# Patient Record
Sex: Male | Born: 1965 | Race: Black or African American | Hispanic: No | Marital: Married | Smoking: Never smoker
Health system: Southern US, Community
[De-identification: ages and names within clinical notes are randomized; demographics above are authoritative.]

## PROBLEM LIST (undated history)

## (undated) DIAGNOSIS — E785 Hyperlipidemia, unspecified: Secondary | ICD-10-CM

## (undated) DIAGNOSIS — N200 Calculus of kidney: Secondary | ICD-10-CM

## (undated) DIAGNOSIS — I10 Essential (primary) hypertension: Secondary | ICD-10-CM

---

## 1997-07-01 HISTORY — PX: LITHOTRIPSY: SUR834

## 1999-02-06 ENCOUNTER — Encounter: Payer: Self-pay | Admitting: Emergency Medicine

## 1999-02-06 ENCOUNTER — Emergency Department (HOSPITAL_COMMUNITY): Admission: EM | Admit: 1999-02-06 | Discharge: 1999-02-06 | Payer: Self-pay | Admitting: Emergency Medicine

## 2001-10-13 ENCOUNTER — Encounter: Payer: Self-pay | Admitting: Emergency Medicine

## 2001-10-13 ENCOUNTER — Emergency Department (HOSPITAL_COMMUNITY): Admission: EM | Admit: 2001-10-13 | Discharge: 2001-10-13 | Payer: Self-pay | Admitting: Emergency Medicine

## 2005-11-01 ENCOUNTER — Encounter: Admission: RE | Admit: 2005-11-01 | Discharge: 2005-11-01 | Payer: Self-pay | Admitting: Family Medicine

## 2007-12-25 ENCOUNTER — Emergency Department (HOSPITAL_COMMUNITY): Admission: EM | Admit: 2007-12-25 | Discharge: 2007-12-26 | Payer: Self-pay | Admitting: Emergency Medicine

## 2010-12-12 ENCOUNTER — Emergency Department (INDEPENDENT_AMBULATORY_CARE_PROVIDER_SITE_OTHER): Payer: Managed Care, Other (non HMO)

## 2010-12-12 ENCOUNTER — Emergency Department (HOSPITAL_BASED_OUTPATIENT_CLINIC_OR_DEPARTMENT_OTHER)
Admission: EM | Admit: 2010-12-12 | Discharge: 2010-12-12 | Disposition: A | Discharge status: Home / Self Care | Payer: Managed Care, Other (non HMO) | Attending: Emergency Medicine | Admitting: Emergency Medicine

## 2010-12-12 DIAGNOSIS — M25569 Pain in unspecified knee: Secondary | ICD-10-CM | POA: Insufficient documentation

## 2010-12-12 DIAGNOSIS — M898X9 Other specified disorders of bone, unspecified site: Secondary | ICD-10-CM | POA: Insufficient documentation

## 2011-03-28 LAB — URINALYSIS, ROUTINE W REFLEX MICROSCOPIC
Bilirubin Urine: NEGATIVE
Specific Gravity, Urine: 1.022
pH: 5.5

## 2011-03-28 LAB — URINE MICROSCOPIC-ADD ON

## 2012-02-26 IMAGING — CR DG KNEE COMPLETE 4+V*L*
4 series · 4 of 4 positions shown · non-contrast
Comparison: None.

CLINICAL DATA: Suprapatellar knee pain.  No known injury.

LEFT KNEE - COMPLETE 4+ VIEW

[t knee ap left]
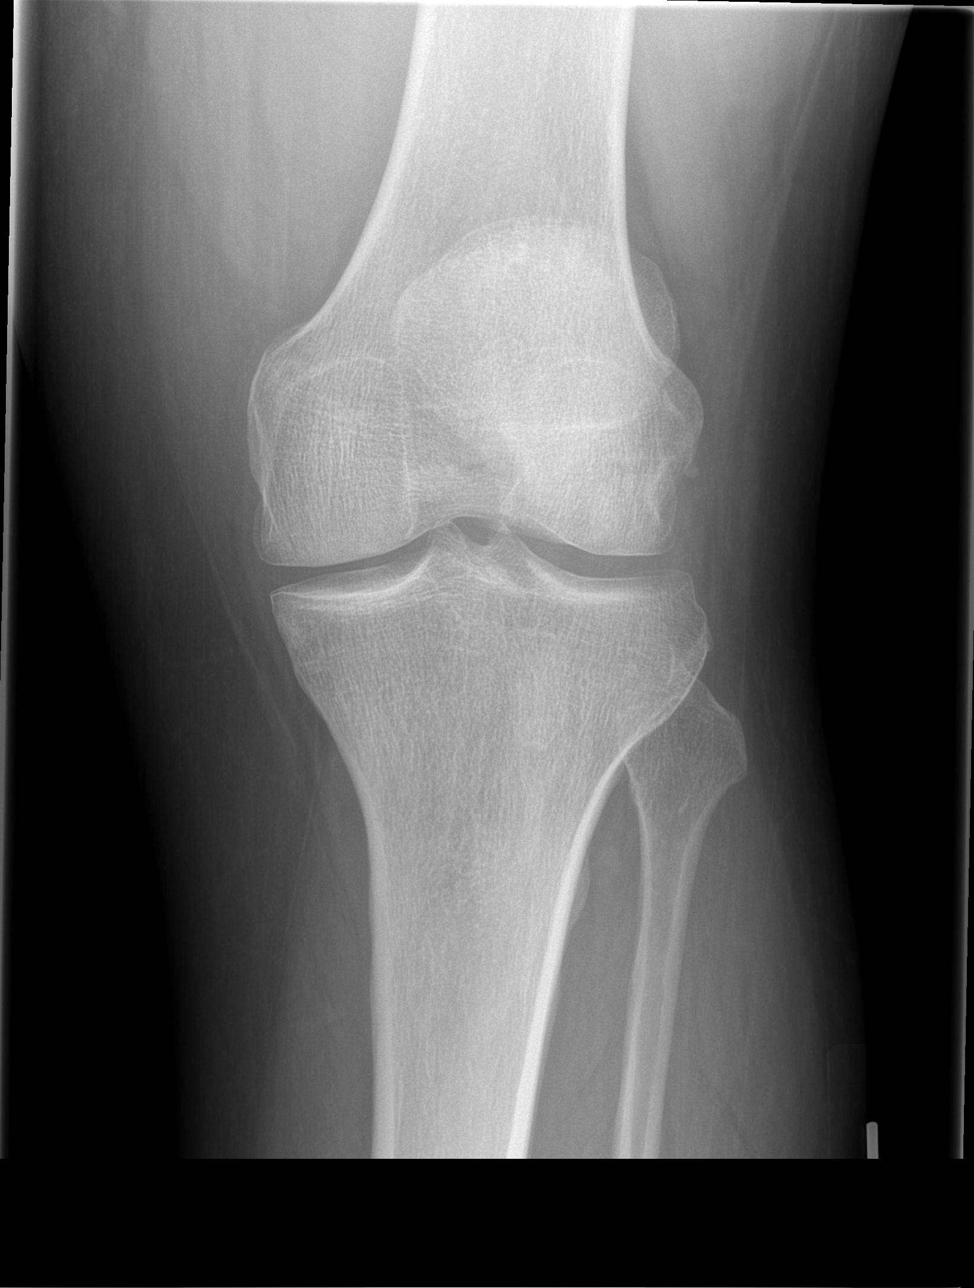

[t knee oblique left (1 of 2)]
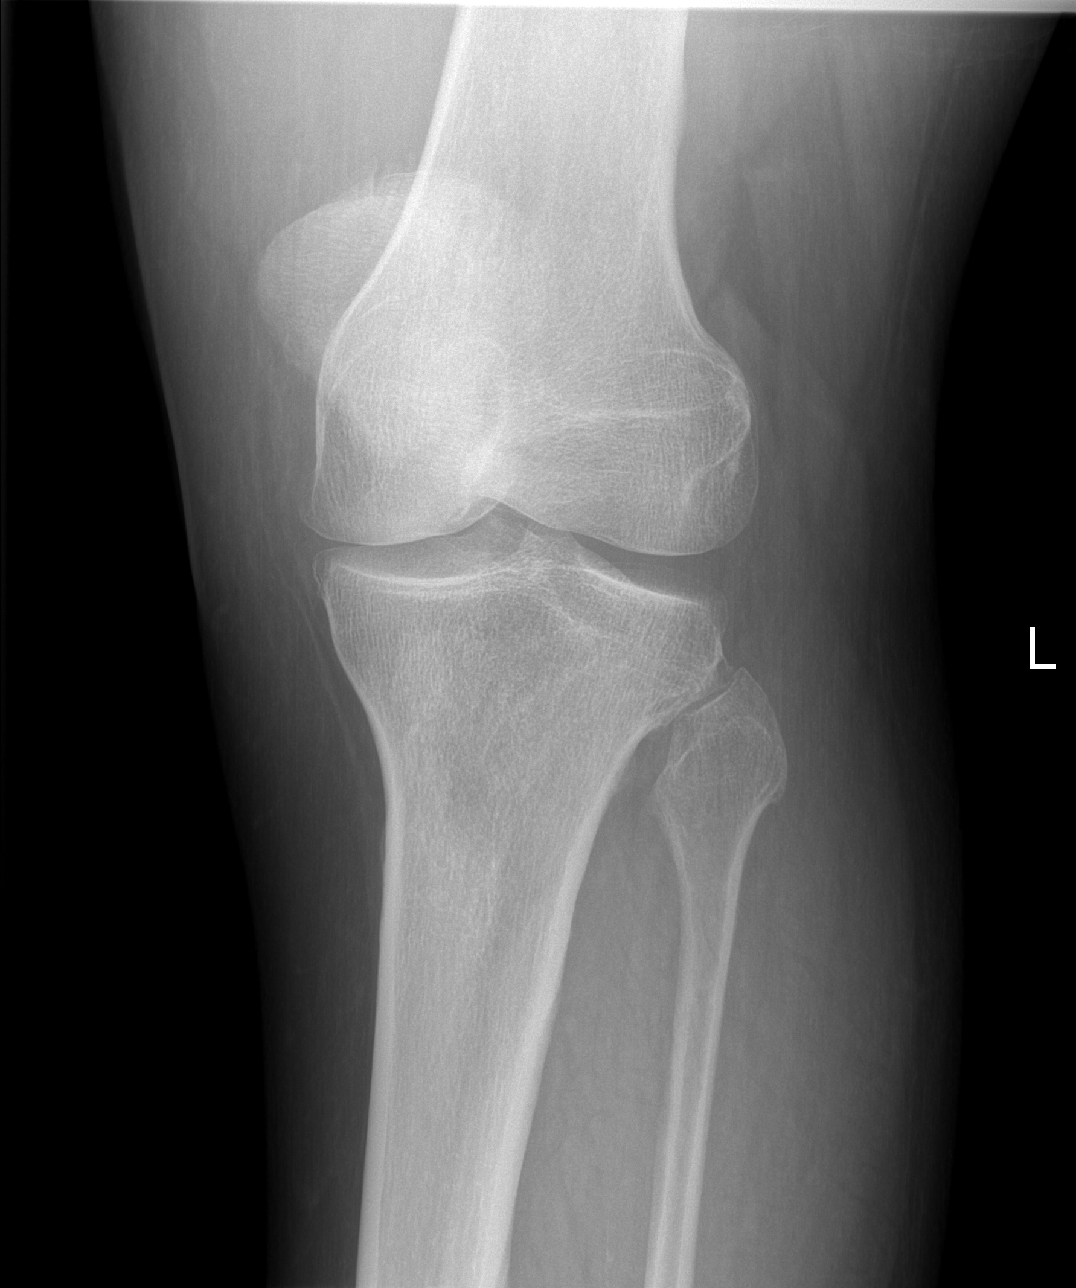

[t knee oblique left (2 of 2)]
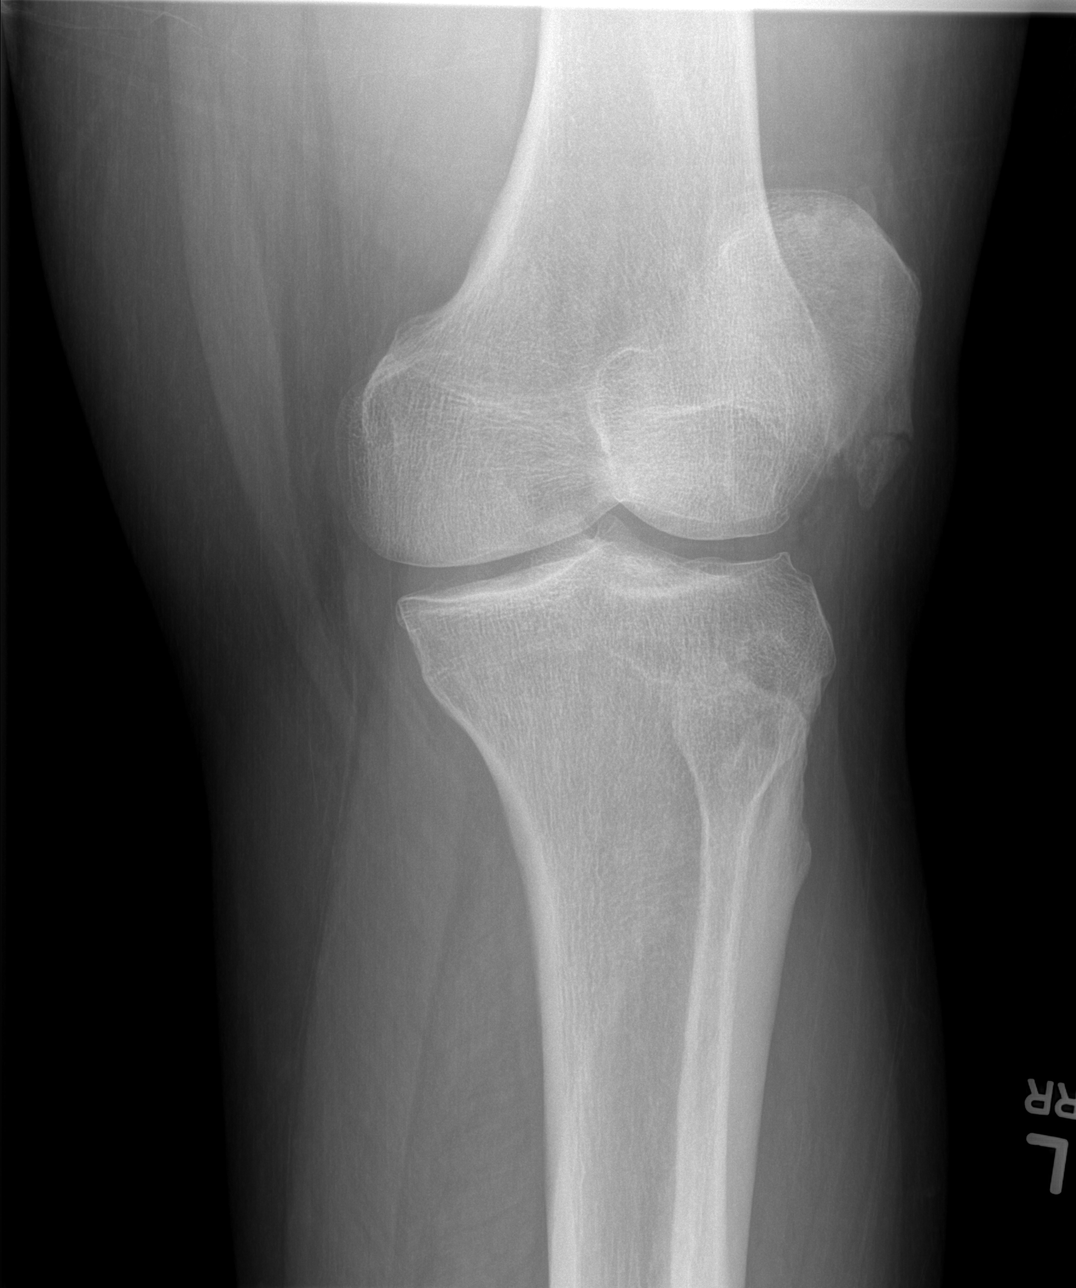

[t knee lat left]
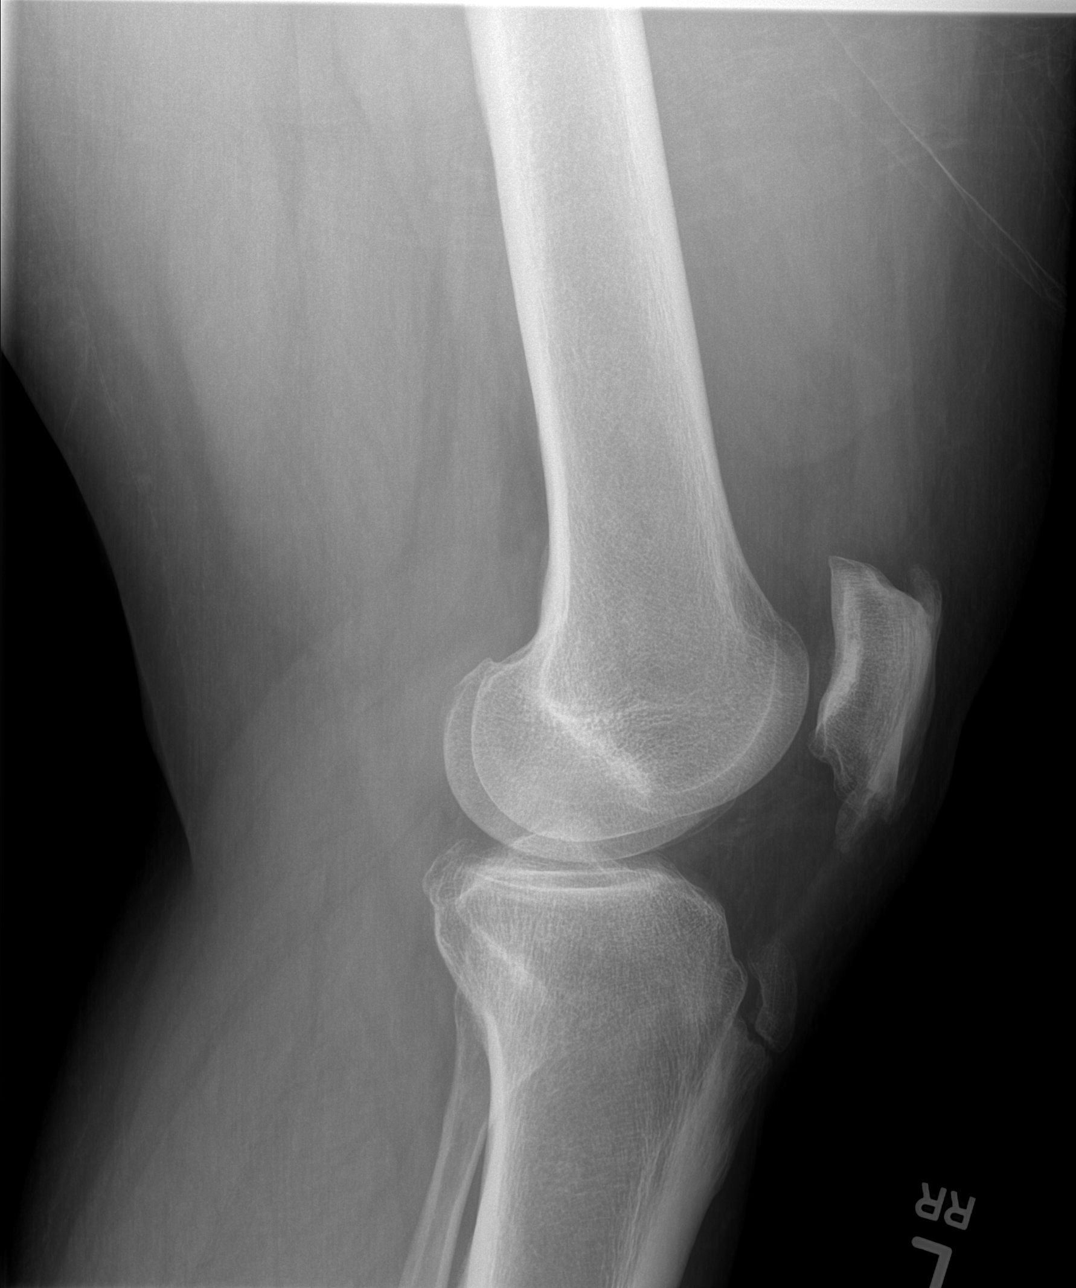

[4 of 4 positions shown; findings below may reference images not displayed]

FINDINGS: The mineralization and alignment are normal.  There is no
evidence of acute fracture or dislocation.  There is spurring at
the quadriceps and patellar insertions on the patella.  There is
developmental fragmentation of the tibial tubercle.  Mild
tricompartmental degenerative changes are present.  There is no
significant knee joint effusion.
IMPRESSION: Patellar spurring and mild tricompartmental degenerative changes.
No acute osseous findings.

## 2016-04-09 ENCOUNTER — Emergency Department (HOSPITAL_BASED_OUTPATIENT_CLINIC_OR_DEPARTMENT_OTHER)
Admission: EM | Admit: 2016-04-09 | Discharge: 2016-04-09 | Disposition: A | Discharge status: Home / Self Care | Payer: Managed Care, Other (non HMO) | Attending: Emergency Medicine | Admitting: Emergency Medicine

## 2016-04-09 ENCOUNTER — Encounter (HOSPITAL_BASED_OUTPATIENT_CLINIC_OR_DEPARTMENT_OTHER): Payer: Self-pay

## 2016-04-09 DIAGNOSIS — L02212 Cutaneous abscess of back [any part, except buttock]: Secondary | ICD-10-CM

## 2016-04-09 DIAGNOSIS — I10 Essential (primary) hypertension: Secondary | ICD-10-CM | POA: Diagnosis not present

## 2016-04-09 HISTORY — DX: Hyperlipidemia, unspecified: E78.5

## 2016-04-09 HISTORY — DX: Essential (primary) hypertension: I10

## 2016-04-09 HISTORY — DX: Calculus of kidney: N20.0

## 2016-04-09 MED ORDER — LIDOCAINE HCL 1 % IJ SOLN
INTRAMUSCULAR | Status: AC
  Administered 2016-04-09: 20 mL
  Filled 2016-04-09: qty 20

## 2016-04-09 MED ORDER — CEPHALEXIN 500 MG PO CAPS: 500.0000 mg | ORAL_CAPSULE | Freq: Four times a day (QID) | ORAL | 0 refills | Status: DC

## 2016-04-09 MED ORDER — ONDANSETRON 8 MG PO TBDP
8.0000 mg | ORAL_TABLET | Freq: Once | ORAL | Status: AC
  Administered 2016-04-09: 8 mg via ORAL
  Filled 2016-04-09: qty 1

## 2016-04-09 MED ORDER — LIDOCAINE HCL (PF) 1 % IJ SOLN
INTRAMUSCULAR | Status: AC
  Administered 2016-04-09: 5 mL
  Filled 2016-04-09: qty 5

## 2016-04-09 MED ORDER — HYDROCODONE-ACETAMINOPHEN 5-325 MG PO TABS
2.0000 | ORAL_TABLET | Freq: Once | ORAL | Status: AC
  Administered 2016-04-09: 2 via ORAL
  Filled 2016-04-09: qty 2

## 2016-04-09 MED ORDER — HYDROCODONE-ACETAMINOPHEN 5-325 MG PO TABS: 1.0000 | ORAL_TABLET | Freq: Four times a day (QID) | ORAL | 0 refills | Status: DC | PRN

## 2016-04-09 MED ORDER — CEFTRIAXONE SODIUM 1 G IJ SOLR
1.0000 g | Freq: Once | INTRAMUSCULAR | Status: AC
  Administered 2016-04-09: 1 g via INTRAMUSCULAR
  Filled 2016-04-09: qty 10

## 2016-04-09 NOTE — ED Provider Notes (Signed)
[facility] Note   CSN: [id number] Arrival date & time: 04/09/16  1916  By signing my name below, I, Soijett Blue, attest that this documentation has been prepared under the direction and in the presence of Geoffery Lyonsouglas Jaycub Noorani, MD. Electronically Signed: Soijett Blue, ED Scribe. 04/09/16. 7:54 PM.   History   Chief Complaint Chief Complaint  Patient presents with  . Abscess    HPI Omar Wheeler is a 50 y.o. male with a PMHx of HTN, who presents to the Emergency Department complaining of abscess to left middle back onset 5 days. Pt states that he went to the doctor 2 days ago and was Rx bactrim without the area being drained. Pt notes that he has had abscesses in the past, but never to this extent. He states that he is having associated symptoms of drainage, fever, and redness to the affected area. He states that he has tried warm compresses/soaks and Rx bactrim for the relief of his symptoms. He denies chills and any other symptoms.    The history is provided by the patient. No language interpreter was used.    Past Medical History:  Diagnosis Date  . Hyperlipidemia   . Hypertension   . Kidney stone     There are no active problems to display for this patient.   Past Surgical History:  Procedure Laterality Date  . LITHOTRIPSY Left 1999       Home Medications    Prior to Admission medications   Not on File    Family History No family history on file.  Social History Social History  Substance Use Topics  . Smoking status: Not on file  . Smokeless tobacco: Not on file  . Alcohol use Not on file     Allergies   Review of patient's allergies indicates no known allergies.   Review of Systems Review of Systems  All other systems reviewed and are negative.    Physical Exam Updated Vital Signs BP 137/91 (BP Location: Left Arm)   Pulse 117   Temp 100 F (37.8 C) (Oral)   Resp 20   Ht 5\' 10"  (1.778 m)   Wt (!) 303 lb (137.4 kg)    SpO2 100%   BMI 43.48 kg/m   Physical Exam  Constitutional: He is oriented to person, place, and time. He appears well-developed and well-nourished. No distress.  HENT:  Head: Normocephalic and atraumatic.  Eyes: EOM are normal.  Neck: Neck supple.  Cardiovascular: Normal rate.   Pulmonary/Chest: Effort normal. No respiratory distress.  Abdominal: He exhibits no distension.  Musculoskeletal: Normal range of motion.  Neurological: He is alert and oriented to person, place, and time.  Skin: Skin is warm and dry. There is erythema.  Left mid back is noted to have a draining lesion present, measuring 5 x 10 cm. There is fluctuance and induration of the overlying skin. Overlying erythema without red streaking.   Psychiatric: He has a normal mood and affect. His behavior is normal.  Nursing note and vitals reviewed.    ED Treatments / Results  DIAGNOSTIC STUDIES: Oxygen Saturation is 100% on RA, nl by my interpretation.    COORDINATION OF CARE: 7:49 PM Discussed treatment plan with pt at bedside which includes I&D, continue bactrim Rx, and pt agreed to plan.   Procedures .Marland Kitchen.Incision and Drainage Date/Time: 04/09/2016 7:53 PM Performed by: Geoffery LyonsELO, Yasmine Kilbourne Authorized by: Geoffery LyonsELO, Jourden Gilson   Consent:    Consent obtained:  Verbal   Consent given by:  Patient   Risks discussed:  Incomplete drainage, pain and infection   Alternatives discussed:  Alternative treatment Location:    Type:  Abscess   Size:  5 x 10 cm   Location:  Trunk   Trunk location:  Back Pre-procedure details:    Skin preparation:  Betadine Anesthesia (see MAR for exact dosages):    Anesthesia method:  Local infiltration   Local anesthetic:  Lidocaine 1% w/o epi (5 cc used) Procedure type:    Complexity:  Simple Procedure details:    Needle aspiration: no     Incision types:  Stab incision   Incision depth:  Dermal   Scalpel blade:  11   Wound management:  Probed and deloculated and irrigated with saline    Drainage:  Purulent   Drainage amount:  Copious   Wound treatment:  Wound left open   Packing materials:  None Post-procedure details:    Patient tolerance of procedure:  Tolerated well, no immediate complications    (including critical care time)  Medications Ordered in ED Medications  lidocaine (XYLOCAINE) 1 % (with pres) injection (not administered)     Initial Impression / Assessment and Plan / ED Course  I have reviewed the triage vital signs and the nursing notes.  Clinical Course    Back abscess I&Ded with copious pus expressed. I will add Keflex to the Bactrim he is are taking. He is also to perform warm soaks. He will also be given pain medication. He understands to return if his symptoms worsen or change.  Final Clinical Impressions(s) / ED Diagnoses   Final diagnoses:  None    New Prescriptions New Prescriptions   No medications on file    I personally performed the services described in this documentation, which was scribed in my presence. The recorded information has been reviewed and is accurate.        Geoffery Lyons, MD 04/09/16 2135

## 2016-04-09 NOTE — ED Notes (Signed)
Pt c/o nausea to registration on way out. Verbal order for ODT Zofran received from Dr. Judd Lienelo and given.

## 2016-04-09 NOTE — Discharge Instructions (Signed)
Continue Bactrim as prescribed. Begin taking Keflex as prescribed today.  Hydrocodone as prescribed as needed for pain.  Continue to apply warm soaks as frequently as possible for the next several days.  Return to the emergency department if your symptoms significantly worsen or change.

## 2016-04-09 NOTE — ED Triage Notes (Signed)
Pt has an abscess that is draining on his middle left back for the last 5 days.  He started on Bactrim Sunday and the pain, redness and swelling has gotten worse and pt is running a fever.

## 2019-02-02 ENCOUNTER — Other Ambulatory Visit: Payer: Self-pay

## 2019-02-02 ENCOUNTER — Ambulatory Visit (INDEPENDENT_AMBULATORY_CARE_PROVIDER_SITE_OTHER): Payer: Managed Care, Other (non HMO) | Admitting: Emergency Medicine

## 2019-02-02 ENCOUNTER — Encounter: Payer: Self-pay | Admitting: Emergency Medicine

## 2019-02-02 VITALS — BP 112/77 | HR 88 | Temp 97.9°F | Ht 70.0 in | Wt 296.2 lb

## 2019-02-02 DIAGNOSIS — Z8639 Personal history of other endocrine, nutritional and metabolic disease: Secondary | ICD-10-CM | POA: Insufficient documentation

## 2019-02-02 DIAGNOSIS — Z6841 Body Mass Index (BMI) 40.0 and over, adult: Secondary | ICD-10-CM

## 2019-02-02 DIAGNOSIS — Z7689 Persons encountering health services in other specified circumstances: Secondary | ICD-10-CM | POA: Diagnosis not present

## 2019-02-02 DIAGNOSIS — E1165 Type 2 diabetes mellitus with hyperglycemia: Secondary | ICD-10-CM | POA: Diagnosis not present

## 2019-02-02 NOTE — Patient Instructions (Addendum)
   If you have lab work done today you will be contacted with your lab results within the next 2 weeks.  If you have not heard from us then please contact us. The fastest way to get your results is to register for My Chart.   IF you received an x-ray today, you will receive an invoice from [facility]. Please contact [facility] at [phone] with questions or concerns regarding your invoice.   IF you received labwork today, you will receive an invoice from [facility]. Please contact [facility] at [phone] with questions or concerns regarding your invoice.   Our billing staff will not be able to assist you with questions regarding bills from these companies.  You will be contacted with the lab results as soon as they are available. The fastest way to get your results is to activate your My Chart account. Instructions are located on the last page of this paperwork. If you have not heard from us regarding the results in 2 weeks, please contact this office.     Diabetes Mellitus and Nutrition, Adult When you have diabetes (diabetes mellitus), it is very important to have healthy eating habits because your blood sugar (glucose) levels are greatly affected by what you eat and drink. Eating healthy foods in the appropriate amounts, at about the same times every day, can help you:  Control your blood glucose.  Lower your risk of heart disease.  Improve your blood pressure.  Reach or maintain a healthy weight. Every person with diabetes is different, and each person has different needs for a meal plan. Your health care provider may recommend that you work with a diet and nutrition specialist (dietitian) to make a meal plan that is best for you. Your meal plan may vary depending on factors such as:  The calories you need.  The medicines you take.  Your weight.  Your blood glucose, blood pressure, and cholesterol levels.  Your activity level.  Other health conditions  you have, such as heart or kidney disease. How do carbohydrates affect me? Carbohydrates, also called carbs, affect your blood glucose level more than any other type of food. Eating carbs naturally raises the amount of glucose in your blood. Carb counting is a method for keeping track of how many carbs you eat. Counting carbs is important to keep your blood glucose at a healthy level, especially if you use insulin or take certain oral diabetes medicines. It is important to know how many carbs you can safely have in each meal. This is different for every person. Your dietitian can help you calculate how many carbs you should have at each meal and for each snack. Foods that contain carbs include:  Bread, cereal, rice, pasta, and crackers.  Potatoes and corn.  Peas, beans, and lentils.  Milk and yogurt.  Fruit and juice.  Desserts, such as cakes, cookies, ice cream, and candy. How does alcohol affect me? Alcohol can cause a sudden decrease in blood glucose (hypoglycemia), especially if you use insulin or take certain oral diabetes medicines. Hypoglycemia can be a life-threatening condition. Symptoms of hypoglycemia (sleepiness, dizziness, and confusion) are similar to symptoms of having too much alcohol. If your health care provider says that alcohol is safe for you, follow these guidelines:  Limit alcohol intake to no more than 1 drink per day for nonpregnant women and 2 drinks per day for men. One drink equals 12 oz of beer, 5 oz of wine, or 1 oz of hard liquor.    Do not drink on an empty stomach.  Keep yourself hydrated with water, diet soda, or unsweetened iced tea.  Keep in mind that regular soda, juice, and other mixers may contain a lot of sugar and must be counted as carbs. What are tips for following this plan?  Reading food labels  Start by checking the serving size on the "Nutrition Facts" label of packaged foods and drinks. The amount of calories, carbs, fats, and other  nutrients listed on the label is based on one serving of the item. Many items contain more than one serving per package.  Check the total grams (g) of carbs in one serving. You can calculate the number of servings of carbs in one serving by dividing the total carbs by 15. For example, if a food has 30 g of total carbs, it would be equal to 2 servings of carbs.  Check the number of grams (g) of saturated and trans fats in one serving. Choose foods that have low or no amount of these fats.  Check the number of milligrams (mg) of salt (sodium) in one serving. Most people should limit total sodium intake to less than 2,300 mg per day.  Always check the nutrition information of foods labeled as "low-fat" or "nonfat". These foods may be higher in added sugar or refined carbs and should be avoided.  Talk to your dietitian to identify your daily goals for nutrients listed on the label. Shopping  Avoid buying canned, premade, or processed foods. These foods tend to be high in fat, sodium, and added sugar.  Shop around the outside edge of the grocery store. This includes fresh fruits and vegetables, bulk grains, fresh meats, and fresh dairy. Cooking  Use low-heat cooking methods, such as baking, instead of high-heat cooking methods like deep frying.  Cook using healthy oils, such as olive, canola, or sunflower oil.  Avoid cooking with butter, cream, or high-fat meats. Meal planning  Eat meals and snacks regularly, preferably at the same times every day. Avoid going long periods of time without eating.  Eat foods high in fiber, such as fresh fruits, vegetables, beans, and whole grains. Talk to your dietitian about how many servings of carbs you can eat at each meal.  Eat 4-6 ounces (oz) of lean protein each day, such as lean meat, chicken, fish, eggs, or tofu. One oz of lean protein is equal to: ? 1 oz of meat, chicken, or fish. ? 1 egg. ?  cup of tofu.  Eat some foods each day that contain  healthy fats, such as avocado, nuts, seeds, and fish. Lifestyle  Check your blood glucose regularly.  Exercise regularly as told by your health care provider. This may include: ? 150 minutes of moderate-intensity or vigorous-intensity exercise each week. This could be brisk walking, biking, or water aerobics. ? Stretching and doing strength exercises, such as yoga or weightlifting, at least 2 times a week.  Take medicines as told by your health care provider.  Do not use any products that contain nicotine or tobacco, such as cigarettes and e-cigarettes. If you need help quitting, ask your health care provider.  Work with a counselor or diabetes educator to identify strategies to manage stress and any emotional and social challenges. Questions to ask a health care provider  Do I need to meet with a diabetes educator?  Do I need to meet with a dietitian?  What number can I call if I have questions?  When are the best times to   check my blood glucose? Where to find more information:  American Diabetes Association: diabetes.org  Academy of Nutrition and Dietetics: www.eatright.org  National Institute of Diabetes and Digestive and Kidney Diseases (NIH): www.niddk.nih.gov Summary  A healthy meal plan will help you control your blood glucose and maintain a healthy lifestyle.  Working with a diet and nutrition specialist (dietitian) can help you make a meal plan that is best for you.  Keep in mind that carbohydrates (carbs) and alcohol have immediate effects on your blood glucose levels. It is important to count carbs and to use alcohol carefully. This information is not intended to replace advice given to you by your health care provider. Make sure you discuss any questions you have with your health care provider. Document Released: 03/14/2005 Document Revised: 05/30/2017 Document Reviewed: 07/22/2016 Elsevier Patient Education  2020 [employer]  

## 2019-02-02 NOTE — Assessment & Plan Note (Signed)
Patient has been noncompliant with metformin and his hemoglobin A1c last June was around 11.  Since he started taking metformin 1000 mg twice a day and states glucose at bedtime around 120.  In the mornings glucose around 140s.  Advised to continue taking metformin along with diet and exercise.  Blood work done today.  Recheck in 3 months.

## 2019-02-02 NOTE — Progress Notes (Signed)
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   Chief Complaint  Patient presents with   Establish Care   Diabetes    HISTORY OF PRESENT ILLNESS: This is a 53 y.o. male here to establish care with me. Has a history of diabetes, on metformin 1000 mg twice a day. Also states he has history of high cholesterol and taking a statin but does not recall the name. Non-smoker.  No EtOH user. No other significant medical history. Has no complaints or medical concerns today.  HPI   Prior to Admission medications   Medication Sig Start Date End Date Taking? Authorizing Provider  Accu-Chek FastClix Lancets MISC  12/22/18  Yes [provider]  Blood Glucose Monitoring Suppl (ACCU-CHEK GUIDE) w/Device KIT  12/22/18  Yes [provider]  Lancets 30G MISC Use to check BS 3 times a day/ DX: E11.9 04/10/16  Yes [provider]  losartan (COZAAR) 25 MG tablet  12/21/18  Yes [provider]  metFORMIN (GLUCOPHAGE) 1000 MG tablet  12/21/18  Yes [provider]    No Known Allergies  There are no active problems to display for this patient.   Past Medical History:  Diagnosis Date   Hyperlipidemia    Hypertension    Kidney stone     Past Surgical History:  Procedure Laterality Date   LITHOTRIPSY Left 1999    Social History   Socioeconomic History   Marital status: Married    Spouse name: Not on file   Number of children: Not on file   Years of education: Not on file   Highest education level: Not on file  Occupational History   Not on file  Social Needs   Financial resource strain: Not on file   Food insecurity    Worry: Not on file    Inability: Not on file   Transportation needs    Medical: Not on file    Non-medical: Not on file  Tobacco Use   Smoking status: Never Smoker   Smokeless tobacco: Never Used  Substance and Sexual Activity   Alcohol use: Never   Frequency: Never   Drug use: Never   Sexual activity: Yes  Lifestyle   Physical activity    Days per week: Not on file    Minutes per session: Not on file   Stress: Not on file  Relationships   Social connections    Talks on phone: Not on file    Gets together: Not on file    Attends religious service: Not on file    Active member of club or organization: Not on file    Attends meetings of clubs or organizations: Not on file    Relationship status: Not on file   Intimate partner violence    Fear of current or ex partner: Not on file    Emotionally abused: Not on file    Physically abused: Not on file    Forced sexual activity: Not on file  Other Topics Concern   Not on file  Social History Narrative   Not on file    History reviewed. No pertinent family history.   Review of Systems  Constitutional: Negative.  Negative for chills and fever.  HENT: Negative.  Negative for nosebleeds and sore throat.   Eyes: Negative.  Negative for blurred vision and double vision.  Respiratory: Negative.  Negative for cough and shortness of breath.   Cardiovascular: Negative.  Negative for chest pain and palpitations.  Gastrointestinal: Negative.  Negative for abdominal pain, blood in stool, diarrhea, melena, nausea and vomiting.  Genitourinary: Negative.  Negative for dysuria and hematuria.  Musculoskeletal: Negative.  Negative for myalgias.  Skin: Negative.   Neurological: Negative.  Negative for dizziness and headaches.  Endo/Heme/Allergies: Negative.   All other systems reviewed and are negative.  Vitals:   02/02/19 1353  BP: 112/77  Pulse: 88  Temp: 97.9 F (36.6 C)  SpO2: 97%     Physical Exam Vitals signs reviewed.  Constitutional:      Appearance: He is obese.  HENT:     Head: Normocephalic and atraumatic.  Eyes:     Extraocular Movements: Extraocular movements intact.     Conjunctiva/sclera: Conjunctivae normal.     Pupils: Pupils are equal, round, and  reactive to light.  Neck:     Musculoskeletal: Normal range of motion and neck supple.  Cardiovascular:     Rate and Rhythm: Normal rate.     Pulses: Normal pulses.     Heart sounds: Normal heart sounds.  Pulmonary:     Effort: Pulmonary effort is normal.     Breath sounds: Normal breath sounds.  Abdominal:     Palpations: Abdomen is soft.     Tenderness: There is no abdominal tenderness.  Musculoskeletal: Normal range of motion.  Skin:    General: Skin is warm and dry.     Capillary Refill: Capillary refill takes less than 2 seconds.  Neurological:     General: No focal deficit present.     Mental Status: He is alert and oriented to person, place, and time.  Psychiatric:        Mood and Affect: Mood normal.        Behavior: Behavior normal.      ASSESSMENT & PLAN: Slayden was seen today for establish care and diabetes.  Diagnoses and all orders for this visit:  Type 2 diabetes mellitus with hyperglycemia, without long-term current use of insulin (HCC) -     CBC with Differential/Platelet -     Comprehensive metabolic panel -     Hemoglobin A1c -     Lipid panel  History of high cholesterol  Encounter to establish care  Morbid obesity with BMI of 40.0-44.9, adult ([place])   Type 2 diabetes mellitus with hyperglycemia, without long-term current use of insulin ([place]) Patient has been noncompliant with metformin and his hemoglobin A1c last June was around 11.  Since he started taking metformin 1000 mg twice a day and states glucose at bedtime around 120.  In the mornings glucose around 140s.  Advised to continue taking metformin along with diet and exercise.  Blood work done today.  Recheck in 3 months.   Patient Instructions       If you have lab work done today you will be contacted with your lab results within the next 2 weeks.  If you have not heard from [place] then please contact us. The fastest way to get your results is to register for My Chart.   IF you received  an x-ray today, you will receive an invoice from [facility]. Please contact [facility] at [phone] with questions or concerns regarding your invoice.   IF you received labwork today, you will receive an invoice from [facility]. Please contact [facility] at [phone] with questions or concerns regarding your invoice.   Our billing staff will not be able to assist you with questions regarding bills from  these companies.  You will be contacted with the lab results as soon as they are available. The fastest way to get your results is to activate your My Chart account. Instructions are located on the last page of this paperwork. If you have not heard from [place] regarding the results in 2 weeks, please contact this office.      Diabetes Mellitus and Nutrition, Adult When you have diabetes (diabetes mellitus), it is very important to have healthy eating habits because your blood sugar (glucose) levels are greatly affected by what you eat and drink. Eating healthy foods in the appropriate amounts, at about the same times every day, can help you:  Control your blood glucose.  Lower your risk of heart disease.  Improve your blood pressure.  Reach or maintain a healthy weight. Every person with diabetes is different, and each person has different needs for a meal plan. Your health care provider may recommend that you work with a diet and nutrition specialist (dietitian) to make a meal plan that is best for you. Your meal plan may vary depending on factors such as:  The calories you need.  The medicines you take.  Your weight.  Your blood glucose, blood pressure, and cholesterol levels.  Your activity level.  Other health conditions you have, such as heart or kidney disease. How do carbohydrates affect me? Carbohydrates, also called carbs, affect your blood glucose level more than any other type of food. Eating carbs naturally raises the amount of glucose in your blood.  Carb counting is a method for keeping track of how many carbs you eat. Counting carbs is important to keep your blood glucose at a healthy level, especially if you use insulin or take certain oral diabetes medicines. It is important to know how many carbs you can safely have in each meal. This is different for every person. Your dietitian can help you calculate how many carbs you should have at each meal and for each snack. Foods that contain carbs include:  Bread, cereal, rice, pasta, and crackers.  Potatoes and corn.  Peas, beans, and lentils.  Milk and yogurt.  Fruit and juice.  Desserts, such as cakes, cookies, ice cream, and candy. How does alcohol affect me? Alcohol can cause a sudden decrease in blood glucose (hypoglycemia), especially if you use insulin or take certain oral diabetes medicines. Hypoglycemia can be a life-threatening condition. Symptoms of hypoglycemia (sleepiness, dizziness, and confusion) are similar to symptoms of having too much alcohol. If your health care provider says that alcohol is safe for you, follow these guidelines:  Limit alcohol intake to no more than 1 drink per day for nonpregnant women and 2 drinks per day for men. One drink equals 12 oz of beer, 5 oz of wine, or 1 oz of hard liquor.  Do not drink on an empty stomach.  Keep yourself hydrated with water, diet soda, or unsweetened iced tea.  Keep in mind that regular soda, juice, and other mixers may contain a lot of sugar and must be counted as carbs. What are tips for following this plan?  Reading food labels  Start by checking the serving size on the "Nutrition Facts" label of packaged foods and drinks. The amount of calories, carbs, fats, and other nutrients listed on the label is based on one serving of the item. Many items contain more than one serving per package.  Check the total grams (g) of carbs in one serving. You can calculate the number of servings  of carbs in one serving by  dividing the total carbs by 15. For example, if a food has 30 g of total carbs, it would be equal to 2 servings of carbs.  Check the number of grams (g) of saturated and trans fats in one serving. Choose foods that have low or no amount of these fats.  Check the number of milligrams (mg) of salt (sodium) in one serving. Most people should limit total sodium intake to less than 2,300 mg per day.  Always check the nutrition information of foods labeled as "low-fat" or "nonfat". These foods may be higher in added sugar or refined carbs and should be avoided.  Talk to your dietitian to identify your daily goals for nutrients listed on the label. Shopping  Avoid buying canned, premade, or processed foods. These foods tend to be high in fat, sodium, and added sugar.  Shop around the outside edge of the grocery store. This includes fresh fruits and vegetables, bulk grains, fresh meats, and fresh dairy. Cooking  Use low-heat cooking methods, such as baking, instead of high-heat cooking methods like deep frying.  Cook using healthy oils, such as olive, canola, or sunflower oil.  Avoid cooking with butter, cream, or high-fat meats. Meal planning  Eat meals and snacks regularly, preferably at the same times every day. Avoid going long periods of time without eating.  Eat foods high in fiber, such as fresh fruits, vegetables, beans, and whole grains. Talk to your dietitian about how many servings of carbs you can eat at each meal.  Eat 4-6 ounces (oz) of lean protein each day, such as lean meat, chicken, fish, eggs, or tofu. One oz of lean protein is equal to: ? 1 oz of meat, chicken, or fish. ? 1 egg. ?  cup of tofu.  Eat some foods each day that contain healthy fats, such as avocado, nuts, seeds, and fish. Lifestyle  Check your blood glucose regularly.  Exercise regularly as told by your health care provider. This may include: ? 150 minutes of moderate-intensity or vigorous-intensity  exercise each week. This could be brisk walking, biking, or water aerobics. ? Stretching and doing strength exercises, such as yoga or weightlifting, at least 2 times a week.  Take medicines as told by your health care provider.  Do not use any products that contain nicotine or tobacco, such as cigarettes and e-cigarettes. If you need help quitting, ask your health care provider.  Work with a Social worker or diabetes educator to identify strategies to manage stress and any emotional and social challenges. Questions to ask a health care provider  Do I need to meet with a diabetes educator?  Do I need to meet with a dietitian?  What number can I call if I have questions?  When are the best times to check my blood glucose? Where to find more information:  American Diabetes Association: diabetes.org  Academy of Nutrition and Dietetics: www.eatright.[employer] of Diabetes and Digestive and Kidney Diseases (NIH): [employer] Summary  A healthy meal plan will help you control your blood glucose and maintain a healthy lifestyle.  Working with a diet and nutrition specialist (dietitian) can help you make a meal plan that is best for you.  Keep in mind that carbohydrates (carbs) and alcohol have immediate effects on your blood glucose levels. It is important to count carbs and to use alcohol carefully. This information is not intended to replace advice given to you by your health care provider. Make sure  you discuss any questions you have with your health care provider. Document Released: 03/14/2005 Document Revised: 05/30/2017 Document Reviewed: 07/22/2016 Elsevier Patient Education  2020 Elsevier Inc.         Agustina Caroli, MD [facility]

## 2019-02-03 ENCOUNTER — Other Ambulatory Visit: Payer: Self-pay | Admitting: Emergency Medicine

## 2019-02-03 DIAGNOSIS — E1165 Type 2 diabetes mellitus with hyperglycemia: Secondary | ICD-10-CM

## 2019-02-03 LAB — COMPREHENSIVE METABOLIC PANEL
ALT: 16 IU/L (ref 0–44)
AST: 15 IU/L (ref 0–40)
Albumin/Globulin Ratio: 1.3 (ref 1.2–2.2)
Albumin: 4.2 g/dL (ref 3.8–4.9)
Alkaline Phosphatase: 62 IU/L (ref 39–117)
BUN/Creatinine Ratio: 25 — ABNORMAL HIGH (ref 9–20)
BUN: 27 mg/dL — ABNORMAL HIGH (ref 6–24)
Bilirubin Total: 0.2 mg/dL (ref 0.0–1.2)
CO2: 21 mmol/L (ref 20–29)
Calcium: 9.9 mg/dL (ref 8.7–10.2)
Chloride: 102 mmol/L (ref 96–106)
Creatinine, Ser: 1.08 mg/dL (ref 0.76–1.27)
GFR calc Af Amer: 91 mL/min/{1.73_m2} (ref >59)
GFR calc non Af Amer: 78 mL/min/{1.73_m2} (ref >59)
Globulin, Total: 3.2 g/dL (ref 1.5–4.5)
Glucose: 165 mg/dL — ABNORMAL HIGH (ref 65–99)
Potassium: 4.4 mmol/L (ref 3.5–5.2)
Sodium: 138 mmol/L (ref 134–144)
Total Protein: 7.4 g/dL (ref 6.0–8.5)

## 2019-02-03 LAB — CBC WITH DIFFERENTIAL/PLATELET
Basophils Absolute: 0 10*3/uL (ref 0.0–0.2)
Basos: 1 %
EOS (ABSOLUTE): 0.1 10*3/uL (ref 0.0–0.4)
Eos: 1 %
Hematocrit: 41 % (ref 37.5–51.0)
Hemoglobin: 13.5 g/dL (ref 13.0–17.7)
Immature Grans (Abs): 0 10*3/uL (ref 0.0–0.1)
Immature Granulocytes: 0 %
Lymphocytes Absolute: 2.9 10*3/uL (ref 0.7–3.1)
Lymphs: 43 %
MCH: 28.6 pg (ref 26.6–33.0)
MCHC: 32.9 g/dL (ref 31.5–35.7)
MCV: 87 fL (ref 79–97)
Monocytes Absolute: 0.6 10*3/uL (ref 0.1–0.9)
Monocytes: 8 %
Neutrophils Absolute: 3.1 10*3/uL (ref 1.4–7.0)
Neutrophils: 47 %
Platelets: 305 10*3/uL (ref 150–450)
RBC: 4.72 x10E6/uL (ref 4.14–5.80)
RDW: 13.2 % (ref 11.6–15.4)
WBC: 6.6 10*3/uL (ref 3.4–10.8)

## 2019-02-03 LAB — LIPID PANEL
Chol/HDL Ratio: 4.3 ratio (ref 0.0–5.0)
Cholesterol, Total: 155 mg/dL (ref 100–199)
HDL: 36 mg/dL — ABNORMAL LOW (ref >39)
LDL Calculated: 90 mg/dL (ref 0–99)
Triglycerides: 144 mg/dL (ref 0–149)
VLDL Cholesterol Cal: 29 mg/dL (ref 5–40)

## 2019-02-03 LAB — HEMOGLOBIN A1C
Est. average glucose Bld gHb Est-mCnc: 301 mg/dL
Hgb A1c MFr Bld: 12.1 % — ABNORMAL HIGH (ref 4.8–5.6)

## 2019-02-03 MED ORDER — GLIPIZIDE 5 MG PO TABS: 5.0000 mg | ORAL_TABLET | Freq: Two times a day (BID) | ORAL | 3 refills | Status: AC

## 2019-02-03 NOTE — Progress Notes (Signed)
Lm for pt to call bk 

## 2019-02-10 ENCOUNTER — Telehealth: Payer: Self-pay | Admitting: Emergency Medicine

## 2019-02-10 NOTE — Telephone Encounter (Signed)
Pt is calling for lab results. Please advise [phone]

## 2019-05-03 ENCOUNTER — Ambulatory Visit: Payer: Managed Care, Other (non HMO) | Admitting: Emergency Medicine

## 2019-05-04 ENCOUNTER — Encounter: Payer: Self-pay | Admitting: Emergency Medicine
